# Patient Record
Sex: Male | Born: 2000 | Race: Black or African American | Hispanic: No | Marital: Single | Smoking: Never smoker
Health system: Southern US, Community
[De-identification: ages and names within clinical notes are randomized; demographics above are authoritative.]

---
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
## 2020-01-20 IMAGING — CR DG KNEE COMPLETE 4+V*L*
4 series · 4 of 4 positions shown · non-contrast
Comparison: None.

CLINICAL DATA: 17-year-old male with a history of lateral knee pain

EXAM:
LEFT KNEE - COMPLETE 4+ VIEW

[t knee ap left]
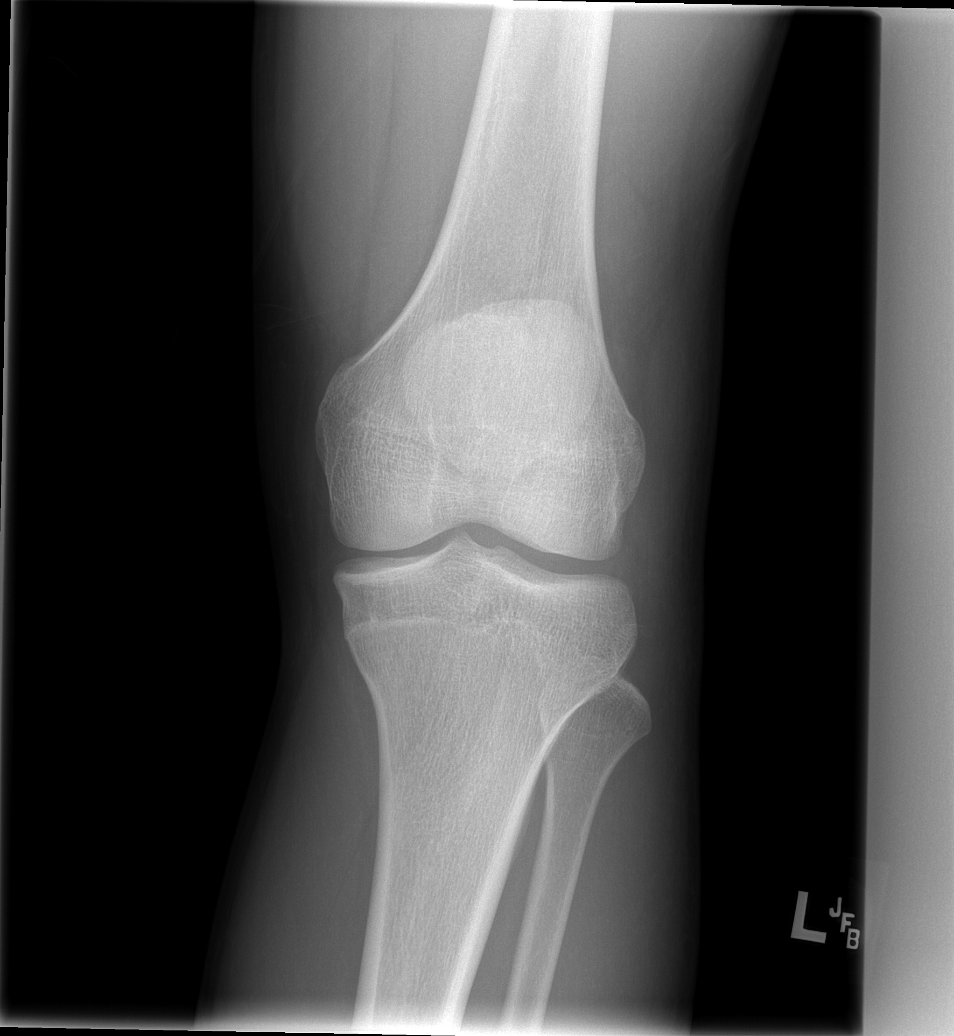

[t knee oblique left (1 of 2)]
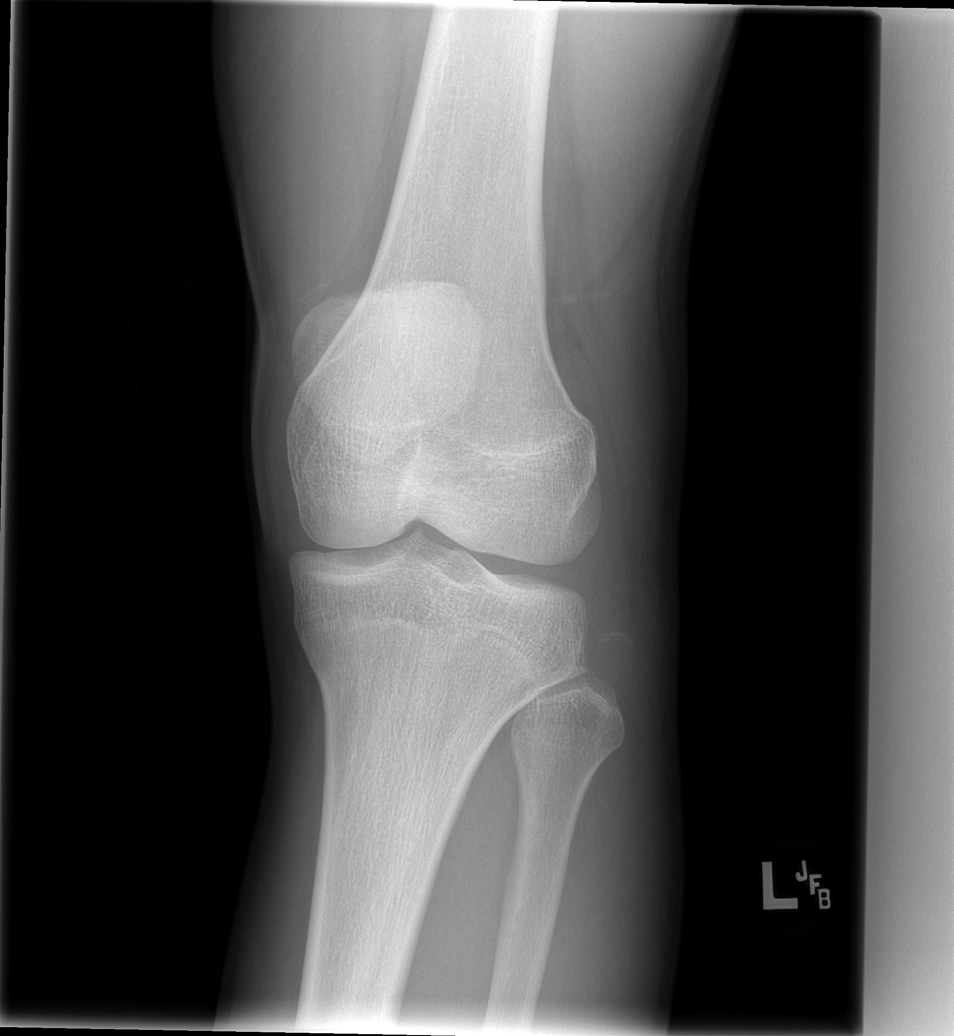

[t knee oblique left (2 of 2)]
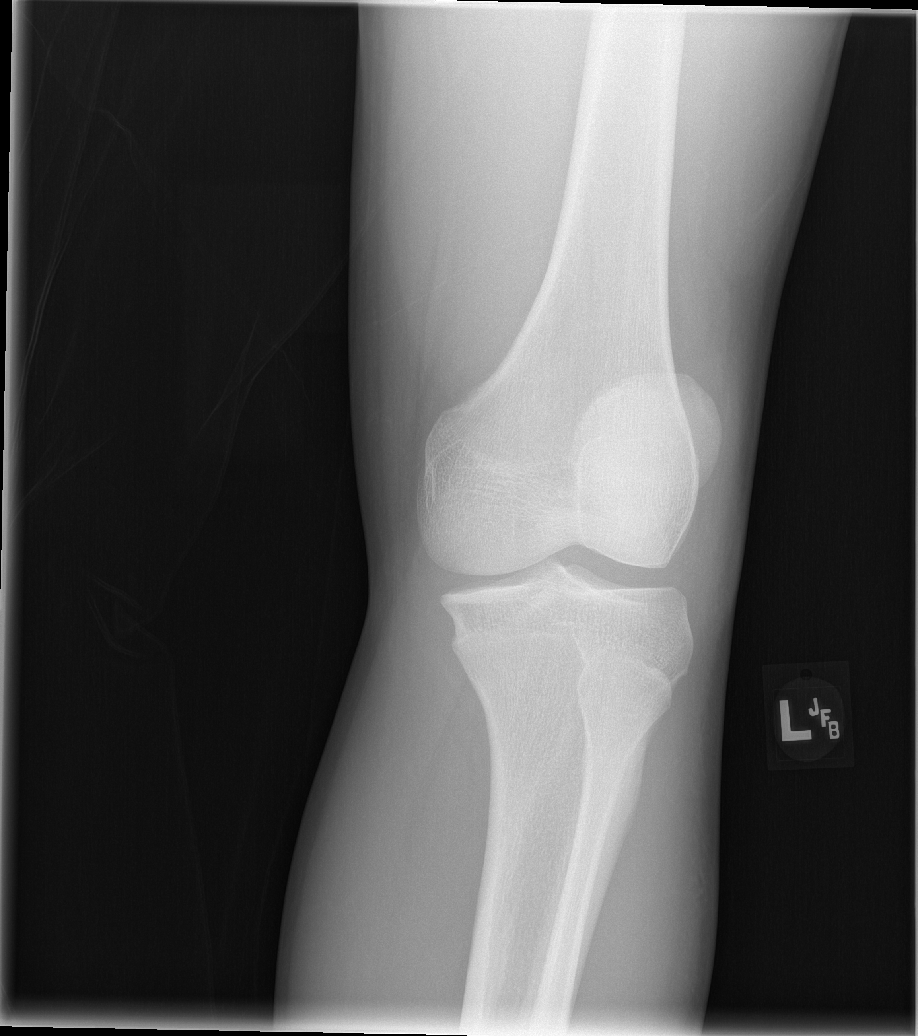

[t knee lat left]
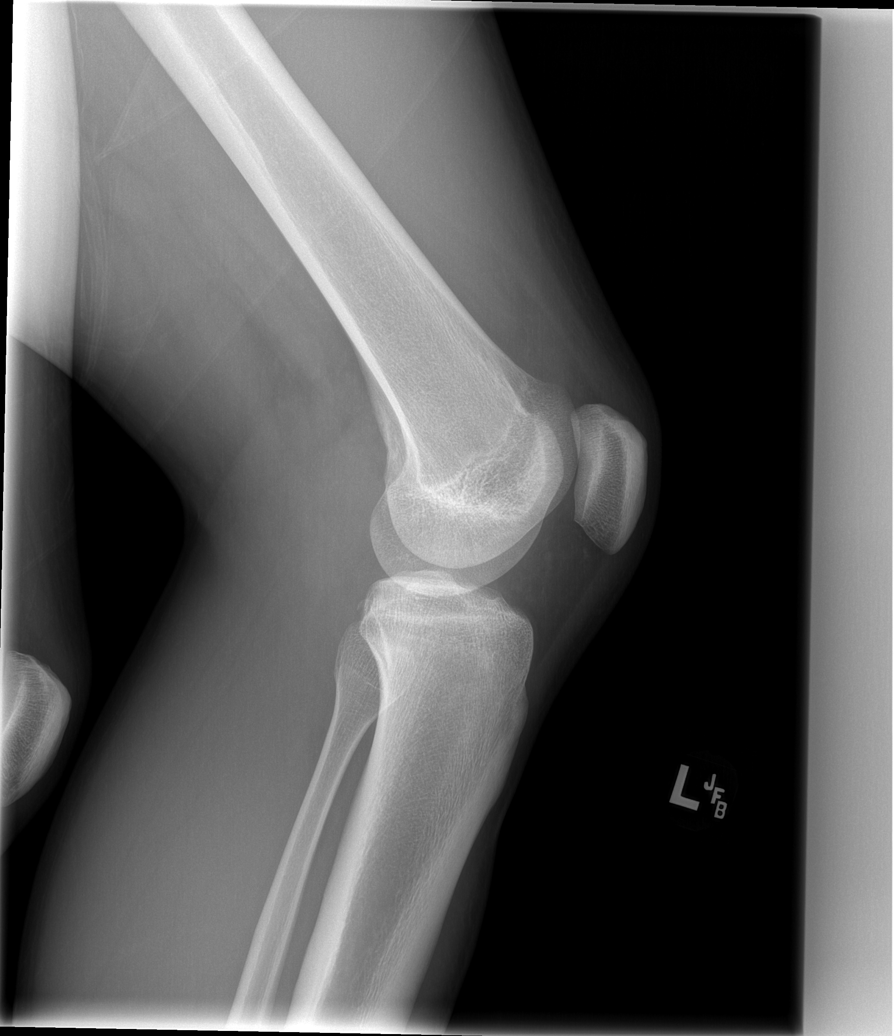

[4 of 4 positions shown; findings below may reference images not displayed]

FINDINGS: Acute avulsion fracture of the proximal fibula with associated soft
tissue swelling.

No joint effusion.
IMPRESSION: Acute avulsion fracture at the head of the fibula (arcuate sign),
which has a high association with posterolateral corner
injury/ligamentous injury, and referral for orthopedic evaluation is
recommended.
